# Patient Record
Sex: Male | Born: 1985 | Race: White | Hispanic: No | Marital: Single | Smoking: Current every day smoker
Health system: Southern US, Community
[De-identification: ages and names within clinical notes are randomized; demographics above are authoritative.]

---

## 2017-12-28 ENCOUNTER — Encounter (HOSPITAL_COMMUNITY): Payer: Self-pay | Admitting: Emergency Medicine

## 2017-12-28 ENCOUNTER — Emergency Department (HOSPITAL_COMMUNITY): Payer: Self-pay

## 2017-12-28 ENCOUNTER — Emergency Department (HOSPITAL_COMMUNITY)
Admission: EM | Admit: 2017-12-28 | Discharge: 2017-12-28 | Disposition: A | Discharge status: Home / Self Care | Payer: Self-pay | Attending: Emergency Medicine | Admitting: Emergency Medicine

## 2017-12-28 DIAGNOSIS — F17200 Nicotine dependence, unspecified, uncomplicated: Secondary | ICD-10-CM | POA: Insufficient documentation

## 2017-12-28 DIAGNOSIS — R079 Chest pain, unspecified: Secondary | ICD-10-CM

## 2017-12-28 DIAGNOSIS — F19929 Other psychoactive substance use, unspecified with intoxication, unspecified: Secondary | ICD-10-CM

## 2017-12-28 DIAGNOSIS — R1084 Generalized abdominal pain: Secondary | ICD-10-CM

## 2017-12-28 LAB — BASIC METABOLIC PANEL
Anion gap: 12 (ref 5–15)
BUN: 10 mg/dL (ref 6–20)
CO2: 22 mmol/L (ref 22–32)
CREATININE: 1.16 mg/dL (ref 0.61–1.24)
Calcium: 10.3 mg/dL (ref 8.9–10.3)
Chloride: 102 mmol/L (ref 98–111)
GFR calc Af Amer: >60 mL/min (ref >60)
GLUCOSE: 103 mg/dL — AB (ref 70–99)
POTASSIUM: 4.3 mmol/L (ref 3.5–5.1)
Sodium: 136 mmol/L (ref 135–145)

## 2017-12-28 LAB — URINALYSIS, ROUTINE W REFLEX MICROSCOPIC
Bilirubin Urine: NEGATIVE
GLUCOSE, UA: NEGATIVE mg/dL
Hgb urine dipstick: NEGATIVE
Ketones, ur: 80 mg/dL — AB
Leukocytes, UA: NEGATIVE
NITRITE: NEGATIVE
PH: 5 (ref 5.0–8.0)
PROTEIN: 30 mg/dL — AB
SPECIFIC GRAVITY, URINE: 1.029 (ref 1.005–1.030)

## 2017-12-28 LAB — RAPID URINE DRUG SCREEN, HOSP PERFORMED
AMPHETAMINES: POSITIVE — AB
BARBITURATES: NOT DETECTED
BENZODIAZEPINES: NOT DETECTED
Cocaine: POSITIVE — AB
Opiates: NOT DETECTED
Tetrahydrocannabinol: NOT DETECTED

## 2017-12-28 LAB — CBC
HEMATOCRIT: 47.9 % (ref 39.0–52.0)
HEMOGLOBIN: 16.3 g/dL (ref 13.0–17.0)
MCH: 30.5 pg (ref 26.0–34.0)
MCHC: 34 g/dL (ref 30.0–36.0)
MCV: 89.7 fL (ref 80.0–100.0)
Platelets: 291 10*3/uL (ref 150–400)
RBC: 5.34 MIL/uL (ref 4.22–5.81)
RDW: 12.2 % (ref 11.5–15.5)
WBC: 9.7 10*3/uL (ref 4.0–10.5)
nRBC: 0 % (ref 0.0–0.2)

## 2017-12-28 LAB — I-STAT TROPONIN, ED: TROPONIN I, POC: 0 ng/mL (ref 0.00–0.08)

## 2017-12-28 LAB — LIPASE, BLOOD: LIPASE: 30 U/L (ref 11–51)

## 2017-12-28 LAB — HEPATIC FUNCTION PANEL
ALT: 19 U/L (ref 0–44)
AST: 25 U/L (ref 15–41)
Albumin: 4.7 g/dL (ref 3.5–5.0)
Alkaline Phosphatase: 62 U/L (ref 38–126)
BILIRUBIN DIRECT: 0.2 mg/dL (ref 0.0–0.2)
BILIRUBIN INDIRECT: 1.1 mg/dL — AB (ref 0.3–0.9)
Total Bilirubin: 1.3 mg/dL — ABNORMAL HIGH (ref 0.3–1.2)
Total Protein: 8 g/dL (ref 6.5–8.1)

## 2017-12-28 LAB — ETHANOL

## 2017-12-28 MED ORDER — SODIUM CHLORIDE 0.9 % IV BOLUS
1000.0000 mL | Freq: Once | INTRAVENOUS | Status: AC
  Administered 2017-12-28: 1000 mL via INTRAVENOUS

## 2017-12-28 MED ORDER — ONDANSETRON HCL 4 MG/2ML IJ SOLN
4.0000 mg | Freq: Once | INTRAMUSCULAR | Status: AC
  Administered 2017-12-28: 4 mg via INTRAVENOUS
  Filled 2017-12-28: qty 2

## 2017-12-28 MED ORDER — KETOROLAC TROMETHAMINE 30 MG/ML IJ SOLN
30.0000 mg | Freq: Once | INTRAMUSCULAR | Status: AC
  Administered 2017-12-28: 30 mg via INTRAVENOUS
  Filled 2017-12-28: qty 1

## 2017-12-28 NOTE — Discharge Instructions (Signed)
You were seen in the ED today with symptoms likely related to unintentional intoxication. We did find traces of cocaine and amphetamine in your urine test. You should not drive your car home. You will be taking the bus. Your labs are normal. Call the PCP listed for a follow up appointment. Return to the ED with any new or worsening symptoms.

## 2017-12-28 NOTE — ED Notes (Signed)
Pt states that he will take the bus home and will not drive his car.
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
## 2017-12-28 NOTE — ED Notes (Signed)
Patient transported to CT 

## 2017-12-30 LAB — URINE CULTURE
Culture: NO GROWTH
Special Requests: NORMAL

## 2018-07-19 ENCOUNTER — Emergency Department (HOSPITAL_COMMUNITY): Payer: Self-pay

## 2018-07-19 ENCOUNTER — Other Ambulatory Visit: Payer: Self-pay

## 2018-07-19 ENCOUNTER — Encounter (HOSPITAL_COMMUNITY): Payer: Self-pay

## 2018-07-19 ENCOUNTER — Emergency Department (HOSPITAL_COMMUNITY)
Admission: EM | Admit: 2018-07-19 | Discharge: 2018-07-19 | Disposition: A | Discharge status: Home / Self Care | Payer: Self-pay | Attending: Emergency Medicine | Admitting: Emergency Medicine

## 2018-07-19 DIAGNOSIS — Z23 Encounter for immunization: Secondary | ICD-10-CM | POA: Insufficient documentation

## 2018-07-19 DIAGNOSIS — F17200 Nicotine dependence, unspecified, uncomplicated: Secondary | ICD-10-CM | POA: Insufficient documentation

## 2018-07-19 DIAGNOSIS — R51 Headache: Secondary | ICD-10-CM | POA: Insufficient documentation

## 2018-07-19 DIAGNOSIS — Y9259 Other trade areas as the place of occurrence of the external cause: Secondary | ICD-10-CM | POA: Insufficient documentation

## 2018-07-19 DIAGNOSIS — S80212A Abrasion, left knee, initial encounter: Secondary | ICD-10-CM | POA: Insufficient documentation

## 2018-07-19 DIAGNOSIS — S80211A Abrasion, right knee, initial encounter: Secondary | ICD-10-CM | POA: Insufficient documentation

## 2018-07-19 DIAGNOSIS — R52 Pain, unspecified: Secondary | ICD-10-CM

## 2018-07-19 DIAGNOSIS — Y929 Unspecified place or not applicable: Secondary | ICD-10-CM | POA: Insufficient documentation

## 2018-07-19 DIAGNOSIS — S0181XA Laceration without foreign body of other part of head, initial encounter: Secondary | ICD-10-CM

## 2018-07-19 DIAGNOSIS — Y999 Unspecified external cause status: Secondary | ICD-10-CM | POA: Insufficient documentation

## 2018-07-19 DIAGNOSIS — Y9389 Activity, other specified: Secondary | ICD-10-CM | POA: Insufficient documentation

## 2018-07-19 MED ORDER — TETANUS-DIPHTH-ACELL PERTUSSIS 5-2.5-18.5 LF-MCG/0.5 IM SUSP
0.5000 mL | Freq: Once | INTRAMUSCULAR | Status: AC
  Administered 2018-07-19: 03:00:00 0.5 mL via INTRAMUSCULAR
  Filled 2018-07-19: qty 0.5

## 2018-07-19 MED ORDER — LIDOCAINE-EPINEPHRINE (PF) 2 %-1:200000 IJ SOLN
20.0000 mL | Freq: Once | INTRAMUSCULAR | Status: AC
  Administered 2018-07-19: 20 mL
  Filled 2018-07-19: qty 20
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
## 2018-07-19 NOTE — ED Notes (Signed)
Patient transported to X-ray 
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
## 2018-07-19 NOTE — Discharge Instructions (Signed)
Your CT scans are negative for fracture or serious brain injury.  As we discussed you may have damage to the muscle or nerve that controls your eyebrow movement.  This should be rechecked by the ENT doctor in the office.  Your stitches should be removed in 1 week.  Return to the ED with worsening pain, fever, chills, nausea, vomiting or other concerns.

## 2018-07-19 NOTE — ED Notes (Signed)
Patient verbalized understanding of dc instructions, vss, ambulatory with nad.   

## 2018-07-19 NOTE — ED Notes (Signed)
Patient ambulated with steady gait and stated that he felt fine. Dr. Manus Gunning has been notified.

## 2018-07-19 NOTE — ED Notes (Signed)
ED Provider at bedside. 

## 2018-07-19 NOTE — ED Notes (Signed)
MD at bedside suturing

## 2018-07-21 ENCOUNTER — Other Ambulatory Visit: Payer: Self-pay

## 2018-07-21 ENCOUNTER — Emergency Department (HOSPITAL_COMMUNITY)
Admission: EM | Admit: 2018-07-21 | Discharge: 2018-07-21 | Discharge status: Against Medical Advice | Payer: Self-pay | Attending: Emergency Medicine | Admitting: Emergency Medicine

## 2018-07-21 ENCOUNTER — Encounter (HOSPITAL_COMMUNITY): Payer: Self-pay | Admitting: Emergency Medicine

## 2018-07-21 DIAGNOSIS — Z5321 Procedure and treatment not carried out due to patient leaving prior to being seen by health care provider: Secondary | ICD-10-CM | POA: Insufficient documentation

## 2018-07-21 NOTE — ED Triage Notes (Signed)
Pt. Stated, Ijust need to talk to a Dr. About my stitches.

## 2018-07-25 ENCOUNTER — Emergency Department (HOSPITAL_COMMUNITY)
Admission: EM | Admit: 2018-07-25 | Discharge: 2018-07-25 | Disposition: A | Discharge status: Home / Self Care | Payer: Self-pay | Attending: Emergency Medicine | Admitting: Emergency Medicine

## 2018-07-25 ENCOUNTER — Other Ambulatory Visit: Payer: Self-pay

## 2018-07-25 ENCOUNTER — Encounter (HOSPITAL_COMMUNITY): Payer: Self-pay | Admitting: Emergency Medicine

## 2018-07-25 DIAGNOSIS — S0181XD Laceration without foreign body of other part of head, subsequent encounter: Secondary | ICD-10-CM | POA: Insufficient documentation

## 2018-07-25 DIAGNOSIS — F17200 Nicotine dependence, unspecified, uncomplicated: Secondary | ICD-10-CM | POA: Insufficient documentation

## 2018-07-25 DIAGNOSIS — Z4802 Encounter for removal of sutures: Secondary | ICD-10-CM | POA: Insufficient documentation

## 2018-07-25 DIAGNOSIS — X58XXXD Exposure to other specified factors, subsequent encounter: Secondary | ICD-10-CM | POA: Insufficient documentation

## 2018-07-25 NOTE — Discharge Instructions (Addendum)
Continue to wash daily with soap and water.  Do not scrub vigorously or the Steri-Strips will fall off. The Steri-Strips will fall off on their own in several days. Return to the emergency room if you develop fevers, severe worsening pain, pus from the area, or any new, worsening, or concerning symptoms.

## 2018-07-25 NOTE — ED Triage Notes (Signed)
Pt here to have stiches removed from above left eye.
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
## 2019-06-18 ENCOUNTER — Emergency Department (HOSPITAL_COMMUNITY): Payer: Self-pay

## 2019-06-18 ENCOUNTER — Inpatient Hospital Stay (HOSPITAL_COMMUNITY)
Admission: EM | Admit: 2019-06-18 | Discharge: 2019-06-18 | DRG: 917 | Discharge status: Against Medical Advice | Payer: Self-pay | Attending: Internal Medicine | Admitting: Internal Medicine

## 2019-06-18 ENCOUNTER — Inpatient Hospital Stay (HOSPITAL_COMMUNITY): Payer: Self-pay

## 2019-06-18 DIAGNOSIS — E878 Other disorders of electrolyte and fluid balance, not elsewhere classified: Secondary | ICD-10-CM | POA: Diagnosis present

## 2019-06-18 DIAGNOSIS — T510X1A Toxic effect of ethanol, accidental (unintentional), initial encounter: Principal | ICD-10-CM | POA: Diagnosis present

## 2019-06-18 DIAGNOSIS — R4189 Other symptoms and signs involving cognitive functions and awareness: Secondary | ICD-10-CM

## 2019-06-18 DIAGNOSIS — Z978 Presence of other specified devices: Secondary | ICD-10-CM

## 2019-06-18 DIAGNOSIS — X31XXXA Exposure to excessive natural cold, initial encounter: Secondary | ICD-10-CM

## 2019-06-18 DIAGNOSIS — R7989 Other specified abnormal findings of blood chemistry: Secondary | ICD-10-CM

## 2019-06-18 DIAGNOSIS — Y909 Presence of alcohol in blood, level not specified: Secondary | ICD-10-CM | POA: Diagnosis present

## 2019-06-18 DIAGNOSIS — F10129 Alcohol abuse with intoxication, unspecified: Secondary | ICD-10-CM | POA: Diagnosis present

## 2019-06-18 DIAGNOSIS — G92 Toxic encephalopathy: Secondary | ICD-10-CM | POA: Diagnosis present

## 2019-06-18 DIAGNOSIS — R569 Unspecified convulsions: Secondary | ICD-10-CM

## 2019-06-18 DIAGNOSIS — Z5329 Procedure and treatment not carried out because of patient's decision for other reasons: Secondary | ICD-10-CM | POA: Diagnosis not present

## 2019-06-18 DIAGNOSIS — J9690 Respiratory failure, unspecified, unspecified whether with hypoxia or hypercapnia: Secondary | ICD-10-CM | POA: Diagnosis present

## 2019-06-18 DIAGNOSIS — T68XXXA Hypothermia, initial encounter: Secondary | ICD-10-CM | POA: Diagnosis present

## 2019-06-18 DIAGNOSIS — J9601 Acute respiratory failure with hypoxia: Secondary | ICD-10-CM

## 2019-06-18 DIAGNOSIS — R739 Hyperglycemia, unspecified: Secondary | ICD-10-CM | POA: Diagnosis present

## 2019-06-18 DIAGNOSIS — Z20822 Contact with and (suspected) exposure to covid-19: Secondary | ICD-10-CM | POA: Diagnosis present

## 2019-06-18 DIAGNOSIS — R578 Other shock: Secondary | ICD-10-CM | POA: Diagnosis present

## 2019-06-18 LAB — CBC WITH DIFFERENTIAL/PLATELET
Abs Immature Granulocytes: 0.04 10*3/uL (ref 0.00–0.07)
Basophils Absolute: 0 10*3/uL (ref 0.0–0.1)
Basophils Relative: 0 %
Eosinophils Absolute: 0.1 10*3/uL (ref 0.0–0.5)
Eosinophils Relative: 1 %
HCT: 46.6 % (ref 39.0–52.0)
Hemoglobin: 15.1 g/dL (ref 13.0–17.0)
Immature Granulocytes: 0 %
Lymphocytes Relative: 26 %
Lymphs Abs: 2.4 10*3/uL (ref 0.7–4.0)
MCH: 30.9 pg (ref 26.0–34.0)
MCHC: 32.4 g/dL (ref 30.0–36.0)
MCV: 95.5 fL (ref 80.0–100.0)
Monocytes Absolute: 0.6 10*3/uL (ref 0.1–1.0)
Monocytes Relative: 6 %
Neutro Abs: 6.1 10*3/uL (ref 1.7–7.7)
Neutrophils Relative %: 67 %
Platelets: 235 10*3/uL (ref 150–400)
RBC: 4.88 MIL/uL (ref 4.22–5.81)
RDW: 12.3 % (ref 11.5–15.5)
WBC: 9.2 10*3/uL (ref 4.0–10.5)
nRBC: 0 % (ref 0.0–0.2)

## 2019-06-18 LAB — COMPREHENSIVE METABOLIC PANEL
ALT: 19 U/L (ref 0–44)
ALT: 15 U/L (ref 0–44)
AST: 32 U/L (ref 15–41)
AST: 24 U/L (ref 15–41)
Albumin: 4.3 g/dL (ref 3.5–5.0)
Albumin: 3.4 g/dL — ABNORMAL LOW (ref 3.5–5.0)
Alkaline Phosphatase: 71 U/L (ref 38–126)
Alkaline Phosphatase: 47 U/L (ref 38–126)
Anion gap: 13 (ref 5–15)
Anion gap: 6 (ref 5–15)
BUN: 13 mg/dL (ref 6–20)
BUN: 10 mg/dL (ref 6–20)
CO2: 24 mmol/L (ref 22–32)
CO2: 22 mmol/L (ref 22–32)
Calcium: 8.8 mg/dL — ABNORMAL LOW (ref 8.9–10.3)
Calcium: 8.2 mg/dL — ABNORMAL LOW (ref 8.9–10.3)
Chloride: 99 mmol/L (ref 98–111)
Chloride: 109 mmol/L (ref 98–111)
Creatinine, Ser: 1.12 mg/dL (ref 0.61–1.24)
Creatinine, Ser: 0.77 mg/dL (ref 0.61–1.24)
GFR calc Af Amer: 46 mL/min — ABNORMAL LOW (ref >60)
GFR calc Af Amer: >60 mL/min (ref >60)
GFR calc non Af Amer: 39 mL/min — ABNORMAL LOW (ref >60)
GFR calc non Af Amer: 55 mL/min — ABNORMAL LOW (ref >60)
Glucose, Bld: 274 mg/dL — ABNORMAL HIGH (ref 70–99)
Glucose, Bld: 83 mg/dL (ref 70–99)
Potassium: 5 mmol/L (ref 3.5–5.1)
Potassium: 3.5 mmol/L (ref 3.5–5.1)
Sodium: 136 mmol/L (ref 135–145)
Sodium: 137 mmol/L (ref 135–145)
Total Bilirubin: 0.4 mg/dL (ref 0.3–1.2)
Total Bilirubin: 0.6 mg/dL (ref 0.3–1.2)
Total Protein: 7.4 g/dL (ref 6.5–8.1)
Total Protein: 5.5 g/dL — ABNORMAL LOW (ref 6.5–8.1)

## 2019-06-18 LAB — SALICYLATE LEVEL: Salicylate Lvl: <7 mg/dL — ABNORMAL LOW (ref 7.0–30.0)

## 2019-06-18 LAB — POCT I-STAT 7, (LYTES, BLD GAS, ICA,H+H)
Acid-base deficit: 4 mmol/L — ABNORMAL HIGH (ref 0.0–2.0)
Acid-base deficit: 2 mmol/L (ref 0.0–2.0)
Bicarbonate: 22.6 mmol/L (ref 20.0–28.0)
Bicarbonate: 23.9 mmol/L (ref 20.0–28.0)
Calcium, Ion: 1.18 mmol/L (ref 1.15–1.40)
Calcium, Ion: 1.14 mmol/L — ABNORMAL LOW (ref 1.15–1.40)
HCT: 45 % (ref 39.0–52.0)
HCT: 35 % — ABNORMAL LOW (ref 39.0–52.0)
Hemoglobin: 15.3 g/dL (ref 13.0–17.0)
Hemoglobin: 11.9 g/dL — ABNORMAL LOW (ref 13.0–17.0)
O2 Saturation: 86 %
O2 Saturation: 100 %
Patient temperature: 90.6
Patient temperature: 93.1
Potassium: 4.1 mmol/L (ref 3.5–5.1)
Potassium: 4 mmol/L (ref 3.5–5.1)
Sodium: 138 mmol/L (ref 135–145)
Sodium: 139 mmol/L (ref 135–145)
TCO2: 24 mmol/L (ref 22–32)
TCO2: 25 mmol/L (ref 22–32)
pCO2 arterial: 38.7 mmHg (ref 32.0–48.0)
pCO2 arterial: 40.4 mmHg (ref 32.0–48.0)
pH, Arterial: 7.352 (ref 7.350–7.450)
pH, Arterial: 7.365 (ref 7.350–7.450)
pO2, Arterial: 42 mmHg — ABNORMAL LOW (ref 83.0–108.0)
pO2, Arterial: 594 mmHg — ABNORMAL HIGH (ref 83.0–108.0)

## 2019-06-18 LAB — LACTIC ACID, PLASMA
Lactic Acid, Venous: 4.2 mmol/L (ref 0.5–1.9)
Lactic Acid, Venous: 3 mmol/L (ref 0.5–1.9)
Lactic Acid, Venous: 2 mmol/L (ref 0.5–1.9)

## 2019-06-18 LAB — CK TOTAL AND CKMB (NOT AT ARMC)
CK, MB: 5.1 ng/mL — ABNORMAL HIGH (ref 0.5–5.0)
Relative Index: 1.9 (ref 0.0–2.5)
Total CK: 274 U/L (ref 49–397)

## 2019-06-18 LAB — AMYLASE: Amylase: 66 U/L (ref 28–100)

## 2019-06-18 LAB — RESPIRATORY PANEL BY RT PCR (FLU A&B, COVID)
Influenza A by PCR: NEGATIVE
Influenza B by PCR: NEGATIVE
SARS Coronavirus 2 by RT PCR: NEGATIVE

## 2019-06-18 LAB — URINALYSIS, ROUTINE W REFLEX MICROSCOPIC
Bilirubin Urine: NEGATIVE
Glucose, UA: >=500 mg/dL — AB
Hgb urine dipstick: NEGATIVE
Ketones, ur: NEGATIVE mg/dL
Leukocytes,Ua: NEGATIVE
Nitrite: NEGATIVE
Protein, ur: NEGATIVE mg/dL
Specific Gravity, Urine: 1.011 (ref 1.005–1.030)
pH: 5 (ref 5.0–8.0)

## 2019-06-18 LAB — RAPID URINE DRUG SCREEN, HOSP PERFORMED
Amphetamines: NOT DETECTED
Barbiturates: NOT DETECTED
Benzodiazepines: POSITIVE — AB
Cocaine: NOT DETECTED
Opiates: NOT DETECTED
Tetrahydrocannabinol: NOT DETECTED

## 2019-06-18 LAB — VOLATILES,BLD-ACETONE,ETHANOL,ISOPROP,METHANOL
Acetone, blood: <0.01 g/dL (ref 0.000–0.010)
Ethanol, blood: 0.042 g/dL — ABNORMAL HIGH (ref 0.000–0.010)
Isopropanol, blood: <0.01 g/dL (ref 0.000–0.010)
Methanol, blood: <0.01 g/dL (ref 0.000–0.010)

## 2019-06-18 LAB — PHOSPHORUS: Phosphorus: 1.9 mg/dL — ABNORMAL LOW (ref 2.5–4.6)

## 2019-06-18 LAB — MAGNESIUM
Magnesium: 2.3 mg/dL (ref 1.7–2.4)
Magnesium: 1.5 mg/dL — ABNORMAL LOW (ref 1.7–2.4)

## 2019-06-18 LAB — ACETAMINOPHEN LEVEL: Acetaminophen (Tylenol), Serum: <10 ug/mL — ABNORMAL LOW (ref 10–30)

## 2019-06-18 LAB — ETHYLENE GLYCOL: Ethylene Glycol Lvl: <5 mg/dL

## 2019-06-18 LAB — ETHANOL: Alcohol, Ethyl (B): 130 mg/dL — ABNORMAL HIGH (ref <10)

## 2019-06-18 LAB — CBG MONITORING, ED: Glucose-Capillary: 251 mg/dL — ABNORMAL HIGH (ref 70–99)

## 2019-06-18 LAB — MRSA PCR SCREENING: MRSA by PCR: NEGATIVE

## 2019-06-18 LAB — CK: Total CK: 213 U/L (ref 49–397)

## 2019-06-18 LAB — LIPASE, BLOOD: Lipase: 24 U/L (ref 11–51)

## 2019-06-18 MED ORDER — MIDAZOLAM HCL 2 MG/2ML IJ SOLN
2.0000 mg | INTRAMUSCULAR | Status: DC | PRN
  Filled 2019-06-18: qty 2

## 2019-06-18 MED ORDER — SODIUM CHLORIDE 0.9 % IV SOLN: INTRAVENOUS | Status: DC

## 2019-06-18 MED ORDER — DOCUSATE SODIUM 50 MG/5ML PO LIQD
100.0000 mg | Freq: Two times a day (BID) | ORAL | Status: DC
  Filled 2019-06-18 (×2): qty 10

## 2019-06-18 MED ORDER — SODIUM CHLORIDE 0.9 % IV BOLUS
1000.0000 mL | Freq: Once | INTRAVENOUS | Status: AC
  Administered 2019-06-18: 05:00:00 1000 mL via INTRAVENOUS

## 2019-06-18 MED ORDER — LACTATED RINGERS IV SOLN: INTRAVENOUS | Status: DC

## 2019-06-18 MED ORDER — ETOMIDATE 2 MG/ML IV SOLN
25.0000 mg | Freq: Once | INTRAVENOUS | Status: AC
  Administered 2019-06-18: 04:00:00 25 mg via INTRAVENOUS

## 2019-06-18 MED ORDER — PANTOPRAZOLE SODIUM 40 MG IV SOLR: 40.0000 mg | Freq: Every day | INTRAVENOUS | Status: DC

## 2019-06-18 MED ORDER — CHLORHEXIDINE GLUCONATE CLOTH 2 % EX PADS
6.0000 | MEDICATED_PAD | Freq: Every day | CUTANEOUS | Status: DC
  Administered 2019-06-18: 10:00:00 6 via TOPICAL

## 2019-06-18 MED ORDER — SODIUM CHLORIDE 0.9 % IV BOLUS
1000.0000 mL | Freq: Once | INTRAVENOUS | Status: AC
  Administered 2019-06-18: 04:00:00 1000 mL via INTRAVENOUS

## 2019-06-18 MED ORDER — THIAMINE HCL 100 MG/ML IJ SOLN
100.0000 mg | Freq: Every day | INTRAMUSCULAR | Status: DC
  Filled 2019-06-18: qty 2

## 2019-06-18 MED ORDER — FENTANYL CITRATE (PF) 100 MCG/2ML IJ SOLN
100.0000 ug | INTRAMUSCULAR | Status: DC | PRN
  Filled 2019-06-18: qty 2

## 2019-06-18 MED ORDER — PROPOFOL 1000 MG/100ML IV EMUL: 0.0000 ug/kg/min | INTRAVENOUS | Status: DC

## 2019-06-18 MED ORDER — POLYETHYLENE GLYCOL 3350 17 G PO PACK
17.0000 g | PACK | Freq: Every day | ORAL | Status: DC
  Filled 2019-06-18: qty 1

## 2019-06-18 MED ORDER — DOCUSATE SODIUM 100 MG PO CAPS: 100.0000 mg | ORAL_CAPSULE | Freq: Two times a day (BID) | ORAL | Status: DC | PRN

## 2019-06-18 MED ORDER — ORAL CARE MOUTH RINSE
15.0000 mL | OROMUCOSAL | Status: DC
  Administered 2019-06-18: 15 mL via OROMUCOSAL

## 2019-06-18 MED ORDER — POLYETHYLENE GLYCOL 3350 17 G PO PACK: 17.0000 g | PACK | Freq: Every day | ORAL | Status: DC | PRN

## 2019-06-18 MED ORDER — ROCURONIUM BROMIDE 50 MG/5ML IV SOLN
75.0000 mg | Freq: Once | INTRAVENOUS | Status: AC
  Administered 2019-06-18: 04:00:00 75 mg via INTRAVENOUS

## 2019-06-18 MED ORDER — MAGNESIUM SULFATE 4 GM/100ML IV SOLN
4.0000 g | Freq: Once | INTRAVENOUS | Status: DC
  Filled 2019-06-18: qty 100

## 2019-06-18 MED ORDER — CHLORHEXIDINE GLUCONATE 0.12% ORAL RINSE (MEDLINE KIT)
15.0000 mL | Freq: Two times a day (BID) | OROMUCOSAL | Status: DC
  Administered 2019-06-18: 10:00:00 15 mL via OROMUCOSAL

## 2019-06-18 MED ORDER — NOREPINEPHRINE 4 MG/250ML-% IV SOLN
2.0000 ug/min | INTRAVENOUS | Status: DC
  Administered 2019-06-18: 2 ug/min via INTRAVENOUS
  Filled 2019-06-18: qty 250

## 2019-06-18 MED ORDER — FOLIC ACID 5 MG/ML IJ SOLN
1.0000 mg | Freq: Every day | INTRAMUSCULAR | Status: DC
  Filled 2019-06-18: qty 0.2

## 2019-06-18 MED ORDER — LORAZEPAM 2 MG/ML IJ SOLN: 2.0000 mg | INTRAMUSCULAR | Status: DC | PRN

## 2019-06-18 MED ORDER — HEPARIN SODIUM (PORCINE) 5000 UNIT/ML IJ SOLN: 5000.0000 [IU] | Freq: Three times a day (TID) | INTRAMUSCULAR | Status: DC

## 2019-06-18 MED ORDER — LORAZEPAM 2 MG/ML IJ SOLN
INTRAMUSCULAR | Status: AC
  Administered 2019-06-18: 04:00:00 2 mg
  Filled 2019-06-18: qty 1

## 2019-06-18 MED ORDER — ROCURONIUM BROMIDE 50 MG/5ML IV SOLN: 75.0000 mg | Freq: Once | INTRAVENOUS | Status: DC

## 2019-06-18 MED ORDER — SODIUM CHLORIDE 0.9 % IV SOLN
250.0000 mL | INTRAVENOUS | Status: DC
  Administered 2019-06-18: 08:00:00 250 mL via INTRAVENOUS

## 2019-06-18 MED ORDER — POTASSIUM PHOSPHATES 15 MMOLE/5ML IV SOLN
24.0000 mmol | Freq: Once | INTRAVENOUS | Status: DC
  Filled 2019-06-18: qty 8

## 2019-06-18 MED ORDER — MIDAZOLAM HCL 2 MG/2ML IJ SOLN
2.0000 mg | INTRAMUSCULAR | Status: DC | PRN
  Administered 2019-06-18 (×2): 2 mg via INTRAVENOUS
  Filled 2019-06-18: qty 2

## 2019-06-18 MED ORDER — MIDAZOLAM 50MG/50ML (1MG/ML) PREMIX INFUSION
0.0000 mg/h | INTRAVENOUS | Status: DC
  Administered 2019-06-18: 08:00:00 0.5 mg/h via INTRAVENOUS
  Filled 2019-06-18: qty 50

## 2019-06-18 MED ORDER — CALCIUM GLUCONATE-NACL 1-0.675 GM/50ML-% IV SOLN
1.0000 g | Freq: Once | INTRAVENOUS | Status: AC
  Administered 2019-06-18: 07:00:00 1000 mg via INTRAVENOUS
  Filled 2019-06-18: qty 50

## 2019-06-18 MED FILL — Medication: Qty: 1 | Status: AC

## 2019-06-18 NOTE — ED Notes (Signed)
75 rocuronium IVP

## 2019-06-18 NOTE — Progress Notes (Signed)
EEG completed, results pending. 

## 2019-06-18 NOTE — ED Notes (Addendum)
Vocal cords visualized. Pt intubated with pos color change on calorimeter.

## 2019-06-18 NOTE — ED Notes (Signed)
Nasal trumpets removed
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
## 2019-06-18 NOTE — Progress Notes (Signed)
Pt insisting that he is leaving the hospital. Per pt, "I can't waste any more time here". Pt refusing to wear monitoring devices but is intermittently allowing me to spot check his vitals.

## 2019-06-18 NOTE — Progress Notes (Signed)
Pt out in hallway demanding to leave. Insisting he does not want to be hooked back up to IV pump.   Cell phone returned to pt, along with 5$ from pocket, clothing, shoes, lighter, cigarettes, hat, keys and loose change.   Both IVs removed from bilateral ACs.  Pt did verify identity: Nicholas Hutchinson DOB [birth year]  AMA paperwork signed by this RN and witnessed by Luther Bradley RN

## 2019-06-18 NOTE — ED Notes (Signed)
MD attempting intubation

## 2019-06-18 NOTE — Progress Notes (Signed)
Inpatient Diabetes Program Recommendations  AACE/ADA: New Consensus Statement on Inpatient Glycemic Control  Target Ranges:  Prepandial:   less than 140 mg/dL      Peak postprandial:   less than 180 mg/dL (1-2 hours)      Critically ill patients:  140 - 180 mg/dL   Results for Brantley Persons [facility] (MRN [id number]) as of 06/18/2019 09:53  Ref. Range 06/18/2019 03:46  Glucose-Capillary Latest Ref Range: 70 - 99 mg/dL 740 (H)  Results for Brantley Persons [facility] (MRN [id number]) as of 06/18/2019 09:53  Ref. Range 06/18/2019 04:07  Glucose Latest Ref Range: 70 - 99 mg/dL 737 (H)   Review of Glycemic Control  Diabetes history: Unknown Outpatient Diabetes medications: unknown Current orders for Inpatient glycemic control: None  Inpatient Diabetes Program Recommendations:    Insulin-Please consider using ICU Glycemic control order set to order CBGs Q4H with Novolog correction Q4H.  HbgA1C: Please consider ordering an A1C to evaluate glycemic control over the past 2-3 months.  Thanks, Orlando Penner, RN, MSN, CDE Diabetes Coordinator Inpatient Diabetes Program [phone] (Team Pager from 8am to 5pm)

## 2019-06-18 NOTE — ED Notes (Signed)
Warm IV fluids ordered to bedside.

## 2019-06-18 NOTE — ED Notes (Signed)
Bair Hugger to bedside.

## 2019-06-18 NOTE — ED Notes (Signed)
resp paged for transport

## 2019-06-18 NOTE — ED Provider Notes (Signed)
[facility] EMERGENCY DEPARTMENT Provider Note  CSN: [id number] Arrival date & time: 06/18/19 0343  Chief Complaint(s) Seizures ED Triage Notes Seymour Bars, RN (Registered Nurse) . [facility] . . 06/18/2019 3:47 AM . . Signed   Pt found unresponsive outside of a hotel for "hours" per hotel staff. [facility] reports bottle of unknown alcohol. Pinpoint pupils. 2 mg narcan given. Pt then began seizing per EMS and was given total of 7.5 mg versed. Pt was brought in being bagged for respiratory support with two trumpets in bilateral nares. 18 g L AC      HPI Nicholas Hutchinson is a 34 y.o. male present for unresponsiveness in the setting of prolonged cold weather exposure who began seizing en route with EMS, no response to Narcan or Versed.  Remainder of history, ROS, and physical exam limited due to patient's condition (unresponsiveness). Additional information was obtained from EMS.   Level V Caveat.    HPI  Past Medical History No past medical history on file. Patient Active Problem List   Diagnosis Date Noted  . Seizures (HCC) 06/18/2019   Home Medication(s) Prior to Admission medications   Not on File                                                                                                                                    Past Surgical History ** The histories are not reviewed yet. Please review them in the "History" navigator section and refresh this SmartLink. Family History No family history on file.  Social History Social History   Tobacco Use  . Smoking status: Not on file  Substance Use Topics  . Alcohol use: Not on file  . Drug use: Not on file   Allergies Patient has no allergy information on record.  Review of Systems Review of Systems  Unable to perform ROS: Patient unresponsive    Physical Exam Vital Signs  I have reviewed the triage vital signs BP 128/79 (BP Location: Left Arm)   Pulse 81   Temp (!) 90.6  F (32.6 C) (Rectal)   Resp 14   Ht 5\' 11"  (1.803 m)   Wt 81.6 kg   SpO2 100%   BMI 25.10 kg/m   Physical Exam Vitals reviewed.  Constitutional:      Appearance: He is well-developed. He is not diaphoretic.     Interventions: Face mask in place.  HENT:     Head: Normocephalic and atraumatic.     Comments: Foaming at the mouth    Nose: Nose normal.  Eyes:     General: No scleral icterus.       Right eye: No discharge.        Left eye: No discharge.     Conjunctiva/sclera: Conjunctivae normal.     Pupils: Pupils are equal, round, and reactive to light.  Cardiovascular:     Rate and Rhythm: Normal rate  and regular rhythm.     Heart sounds: No murmur. No friction rub. No gallop.   Pulmonary:     Effort: Tachypnea and respiratory distress present.     Breath sounds: Normal breath sounds. No stridor. No rales.  Abdominal:     General: There is no distension.     Palpations: Abdomen is soft.     Tenderness: There is no abdominal tenderness.  Musculoskeletal:        General: No tenderness.     Cervical back: Normal range of motion and neck supple.  Skin:    General: Skin is warm and dry.     Findings: No erythema or rash.  Neurological:     Mental Status: He is unresponsive.     Motor: Abnormal muscle tone (increased tone) and seizure activity present.     Comments: Decerebrate posturing     ED Results and Treatments Labs (all labs ordered are listed, but only abnormal results are displayed) Labs Reviewed  COMPREHENSIVE METABOLIC PANEL - Abnormal; Notable for the following components:      Result Value   Glucose, Bld 274 (*)    Calcium 8.8 (*)    GFR calc non Af Amer 39 (*)    GFR calc Af Amer 46 (*)    All other components within normal limits  CK TOTAL AND CKMB (NOT AT [facility]) - Abnormal; Notable for the following components:   CK, MB 5.1 (*)    All other components within normal limits  LACTIC ACID, PLASMA - Abnormal; Notable for the following components:    Lactic Acid, Venous 4.2 (*)    All other components within normal limits  LACTIC ACID, PLASMA - Abnormal; Notable for the following components:   Lactic Acid, Venous 3.0 (*)    All other components within normal limits  RAPID URINE DRUG SCREEN, HOSP PERFORMED - Abnormal; Notable for the following components:   Benzodiazepines POSITIVE (*)    All other components within normal limits  URINALYSIS, ROUTINE W REFLEX MICROSCOPIC - Abnormal; Notable for the following components:   Color, Urine STRAW (*)    Glucose, UA >=500 (*)    Bacteria, UA RARE (*)    All other components within normal limits  CBG MONITORING, ED - Abnormal; Notable for the following components:   Glucose-Capillary 251 (*)    All other components within normal limits  POCT I-STAT 7, (LYTES, BLD GAS, ICA,H+H) - Abnormal; Notable for the following components:   pO2, Arterial 42 (*)    Acid-base deficit 4.0 (*)    All other components within normal limits  POCT I-STAT 7, (LYTES, BLD GAS, ICA,H+H) - Abnormal; Notable for the following components:   pO2, Arterial 594 (*)    Calcium, Ion 1.14 (*)    HCT 35.0 (*)    Hemoglobin 11.9 (*)    All other components within normal limits  RESPIRATORY PANEL BY RT PCR (FLU A&B, COVID)  CULTURE, BLOOD (ROUTINE X 2)  CULTURE, BLOOD (ROUTINE X 2)  URINE CULTURE  CULTURE, RESPIRATORY  CBC WITH DIFFERENTIAL/PLATELET  MAGNESIUM  SALICYLATE LEVEL  ACETAMINOPHEN LEVEL  ETHANOL  BLOOD GAS, ARTERIAL  CBG MONITORING, ED  EKG  EKG Interpretation  Date/Time:  Friday June 18 2019 03:59:06 EDT Ventricular Rate:  79 PR Interval:    QRS Duration: 84 QT Interval:  401 QTC Calculation: 460 R Axis:   84 Text Interpretation: Sinus rhythm Borderline right axis deviation NO STEMI. No old tracing to compare Confirmed by Addison Lank [phone]) on 06/18/2019 4:32:58 AM       Radiology CT Head Wo Contrast  Result Date: 06/18/2019 CLINICAL DATA:  Head trauma. Found down. EXAM: CT HEAD WITHOUT CONTRAST TECHNIQUE: Contiguous axial images were obtained from the base of the skull through the vertex without intravenous contrast. COMPARISON:  None. FINDINGS: Brain: The ventricles are normal in size and configuration. No extra-axial fluid collections are identified. The gray-white differentiation is maintained. No CT findings for acute hemispheric infarction or intracranial hemorrhage. No mass lesions. The brainstem and cerebellum are normal. Vascular: No hyperdense vessels or obvious aneurysm. Skull: No acute skull fracture. No bone lesion. Sinuses/Orbits: Ethmoid and right maxillary sinus disease. The mastoid air cells and middle ear cavities are clear. The globes are intact. Other: No scalp hematoma or obvious laceration. There is moderate fluid and debris in the posterior nasopharynx probably related to the endotracheal tube and NG tubes. IMPRESSION: 1. No acute intracranial findings or skull fracture. 2. Ethmoid and right maxillary sinus disease. Electronically Signed   By: Marijo Sanes M.D.   On: 06/18/2019 05:25   DG Chest Port 1 View  Result Date: 06/18/2019 CLINICAL DATA:  Male of unknown age unresponsive. Intubated. EXAM: PORTABLE CHEST 1 VIEW COMPARISON:  None. FINDINGS: Portable AP supine view at 0411 hours. Endotracheal tube tip in good position between the level the clavicles and carina. Enteric tube courses to the abdomen and is looped in the left upper quadrant compatible with placement into the stomach. Normal cardiac size and mediastinal contours. Normal lung volumes. Allowing for portable technique the lungs are clear. No pneumothorax or pleural effusion evident on this supine view. No acute osseous abnormality identified. IMPRESSION: 1. Endotracheal tube tip in good position. Enteric tube placed into the stomach. 2. No acute cardiopulmonary abnormality identified.  Electronically Signed   By: Genevie Ann M.D.   On: 06/18/2019 04:46   DG Abd Portable 1 View  Result Date: 06/18/2019 CLINICAL DATA:  Male of unknown age unresponsive. Intubated. EXAM: PORTABLE ABDOMEN - 1 VIEW COMPARISON:  Portable chest from the same time. FINDINGS: Portable AP supine view at 0413 hours. Enteric tube placed into the stomach with side hole at the level of the gastric fundus. Mild gas distended stomach. Normal visible bowel gas pattern otherwise. Lung bases appear clear. No osseous abnormality identified. IMPRESSION: Enteric tube placed into the stomach which is mildly distended with gas. Tube side hole at the gastric fundus. Electronically Signed   By: Genevie Ann M.D.   On: 06/18/2019 04:48    Pertinent labs & imaging results that were available during my care of the patient were reviewed by me and considered in my medical decision making (see chart for details).  Medications Ordered in ED Medications  fentaNYL (SUBLIMAZE) injection 100 mcg (has no administration in time range)  fentaNYL (SUBLIMAZE) injection 100 mcg (has no administration in time range)  midazolam (VERSED) injection 2 mg (2 mg Intravenous Given 06/18/19 0537)  midazolam (VERSED) injection 2 mg (has no administration in time range)  sodium chloride 0.9 % bolus 1,000 mL (0 mLs Intravenous Stopped 06/18/19 0610)    And  sodium chloride 0.9 % bolus 1,000 mL (1,000  mLs Intravenous New Bag/Given 06/18/19 0523)    And  0.9 %  sodium chloride infusion ( Intravenous New Bag/Given 06/18/19 0610)  lactated ringers infusion (has no administration in time range)  heparin injection 5,000 Units (has no administration in time range)  pantoprazole (PROTONIX) injection 40 mg (has no administration in time range)  docusate sodium (COLACE) capsule 100 mg (has no administration in time range)  polyethylene glycol (MIRALAX / GLYCOLAX) packet 17 g (has no administration in time range)  calcium gluconate 1 g/ 50 mL sodium chloride IVPB (has  no administration in time range)  thiamine (B-1) injection 100 mg (has no administration in time range)  folic acid injection 1 mg (has no administration in time range)  LORazepam (ATIVAN) 2 MG/ML injection (2 mg  Given 06/18/19 0349)  etomidate (AMIDATE) injection 25 mg (25 mg Intravenous Given 06/18/19 0356)  rocuronium (ZEMURON) injection 75 mg (75 mg Intravenous Given 06/18/19 0356)                                                                                                                                    Procedures Procedure Name: Intubation Date/Time: 06/18/2019 4:27 AM Performed by: Nira Connardama, Mitsue Peery Eduardo, MD Pre-anesthesia Checklist: Patient identified, Patient being monitored, Emergency Drugs available, Timeout performed and Suction available Oxygen Delivery Method: Non-rebreather mask Preoxygenation: Pre-oxygenation with 100% oxygen Induction Type: Rapid sequence Ventilation: Mask ventilation without difficulty Laryngoscope Size: Glidescope and 4 Tube size: 7.5 mm Number of attempts: 1 Placement Confirmation: ETT inserted through vocal cords under direct vision,  CO2 detector and Breath sounds checked- equal and bilateral Secured at: 24 cm Tube secured with: ETT holder Difficulty Due To: Difficulty was unanticipated    .Critical Care Performed by: Nira Connardama, Marshelle Bilger Eduardo, MD Authorized by: Nira Connardama, Tymara Saur Eduardo, MD     CRITICAL CARE Performed by: Amadeo GarnetPedro Eduardo Vashti Bolanos Total critical care time:  78 minutes Critical care time was exclusive of separately billable procedures and treating other patients. Critical care was necessary to treat or prevent imminent or life-threatening deterioration. Critical care was time spent personally by me on the following activities: development of treatment plan with patient and/or surrogate as well as nursing, discussions with consultants, evaluation of patient's response to treatment, examination of patient, obtaining history from  patient or surrogate, ordering and performing treatments and interventions, ordering and review of laboratory studies, ordering and review of radiographic studies, pulse oximetry and re-evaluation of patient's condition.   (including critical care time)  Medical Decision Making / ED Course I have reviewed the nursing notes for this encounter and the patient's prior records (if available in EHR or on provided paperwork).   Nicholas Hutchinson was evaluated in Emergency Department on 06/18/2019 for the symptoms described in the history of present illness. He was evaluated in the context of the global COVID-19 pandemic, which necessitated consideration that the patient might be at risk for infection with the SARS-CoV-2 virus  that causes COVID-19. Institutional protocols and algorithms that pertain to the evaluation of patients at risk for COVID-19 are in a state of rapid change based on information released by regulatory bodies including the CDC and federal and state organizations. These policies and algorithms were followed during the patient's care in the ED.  Patient presents unresponsive found outside of a motel in cold weather. Began seizing after Narcan administration which did not respond to Versed.  CBG of 314 by EMS.  Hemodynamically stable per EMS.  With decerebrate rigidity versus tonic seizure.  Did not respond to 2 mg of Ativan. Patient required RSI for airway protection Once paralytic were administered, vitals were easier to obtain and patient was noted to be hemodynamically stable.  He was found to be hypothermic at 32.6C.  Warming measures initiated with bear hugger and warmed IV fluids.  Screening labs and imaging ordered.  CBG here 251.  CBC without leukocytosis, anemia or thrombocytopenia.  Metabolic panel without significant electrolyte derangements or renal sufficiency.  Hyperglycemia without evidence of DKA.  Elevated lactic acid greater than 4.  Believe this is likely related  to seizing and not sepsis.  Chest x-ray without evidence of pneumonia. CT head without ICH or mass-effect.  5:46 AM  Consulted neurology for stat EEG to assess for subclinical seizures. Will consult ICU for admission, further work-up, and further management.  ABG as above with high O2. FiO2 dropped to 50%  6:35 AM Spoke with ICU who will eval and admit      Final Clinical Impression(s) / ED Diagnoses Final diagnoses:  Unresponsive  Seizure-like activity (HCC)  Elevated lactic acid level  Hypothermia due to cold environment      This chart was dictated using voice recognition software.  Despite best efforts to proofread,  errors can occur which can change the documentation meaning.   Nira Conn, MD 06/18/19 [phone]

## 2019-06-18 NOTE — ED Notes (Signed)
Inserted 16 fr. Foley without issues. 10 cc sterile water in bulb.

## 2019-06-18 NOTE — ED Notes (Signed)
Breath sounds diminished on L side per MD

## 2019-06-18 NOTE — Procedures (Signed)
Extubation Procedure Note  Patient Details:   Name: Nicholas Hutchinson DOB: 02/25/1876 MRN: [id number]   Airway Documentation:    Vent end date: 06/18/19 Vent end time: 1240   Evaluation  O2 sats: stable throughout Complications: No apparent complications Patient did tolerate procedure well. Bilateral Breath Sounds: Clear   Yes   RT extubated patient to 4L [place] per MD order with RN at bedside. Patient tolerated well. No stridor noted. RT will continue to monitor as needed.   Lura Em 06/18/2019, 1:22 PM

## 2019-06-18 NOTE — ED Notes (Signed)
Secondary assessment complete, no trauma noted

## 2019-06-18 NOTE — H&P (Signed)
NAME:  Nicholas Hutchinson, MRN:  [id number], DOB:  02/25/1876, LOS: 0 ADMISSION DATE:  06/18/2019, CONSULTATION DATE: 06/18/2019 REFERRING MD: ED, CHIEF COMPLAINT: Seizure  Brief History   34 year old found down administered Narcan began disease brought to emergency room in status  History of present illness   Patient is a Nicholas Hutchinson found unresponsive outside about 4 hours hotel.  EMT arrived at the scene administered 2 mg of Narcan patient developed seizure activity he was given 7 mg of Versed and 2 mg Ativan being bagged on the way to the emergency room.  Once in the ER he was intubated.  Core body temperature was 90 F he was started on warming.  On examination there is no evidence of bruising or lacerations.  CT scan of the brain was normal.  Post intubation chest x-ray is clear.  ABG shows pH 7.365 P C O2 of 40 with a PO2 of 594 post intubation.  2 saturation was 90% time of arrival.  Chemistries only pertinent for hyperglycemia with a glucose of 274 otherwise unremarkable.  White count is 11 hemoglobin is 15 platelet count was 235.  Past Medical History  Other than above no other medical history available identification unclear  Significant Hospital Events   Intubation and admission to the ICU 06/18/2019  Consults:  Neurology  Procedures:  Intubation  Significant Diagnostic Tests:  As above  Micro Data:  NA  Antimicrobials:  NA  Interim history/subjective:  NA  Objective   Blood pressure (!) 89/58, pulse 65, temperature (!) 94.6 F (34.8 C), resp. rate 18, height 5\' 11"  (1.803 m), weight 81.6 kg, SpO2 100 %.    Vent Mode: PRVC FiO2 (%):  [50 %-100 %] 50 % Set Rate:  [18 bmp] 18 bmp Vt Set:  [600 mL] 600 mL PEEP:  [5 cmH20] 5 cmH20   Intake/Output Summary (Last 24 hours) at 06/18/2019 2952 Last data filed at 06/18/2019 0543 Gross per 24 hour  Intake --  Output 655 ml  Net -655 ml   Filed Weights   06/18/19 0405  Weight: 81.6 kg     Examination: General: Well-developed male HENT: Intubated some scleral injection Lungs: Clear bilaterally Cardiovascular: Regular rate and rhythm Abdomen: Scaphoid benign bowel sounds hypoactive Extremities: Within normal limits Neuro: Pupils fixed at 2 mm with poor oculogyric movement although nursing reports some spontaneous movement with blood gas GU: Within normal limits  Resolved Hospital Problem list   NA  Assessment & Plan:  1.  Status post seizure: We will place on continuous Versed drip per neurology recommendations.  Will monitor in the ICU setting.  Once await further history may elucidate etiology of seizure.  2.  Hypothermia: Will warmed to 98 degrees.  3.  Respiratory failure: We will maintain current level of ventilator support.  Will add sedative agent if necessary maintain medical support devices  4.  For hyperglycemia we will place on sliding scale insulin.  Best practice:  Diet: N.p.o. Pain/Anxiety/Delirium protocol (if indicated): Continuous Versed per neurology recommendations VAP protocol (if indicated): Yes DVT prophylaxis: SCD GI prophylaxis: Pepcid Glucose control: Sliding scale Mobility: Bedrest Code Status: Full Family Communication: Unable to obtain Disposition: To ICU for observation therapy  Labs   CBC: Recent Labs  Lab 06/18/19 0407 06/18/19 0517 06/18/19 0556  WBC 9.2  --   --   NEUTROABS 6.1  --   --   HGB 15.1 15.3 11.9*  HCT 46.6 45.0 35.0*  MCV 95.5  --   --  PLT 235  --   --     Basic Metabolic Panel: Recent Labs  Lab 06/18/19 0407 06/18/19 0414 06/18/19 0517 06/18/19 0556  NA 136  --  138 139  K 5.0  --  4.1 4.0  CL 99  --   --   --   CO2 24  --   --   --   GLUCOSE 274*  --   --   --   BUN 13  --   --   --   CREATININE 1.12  --   --   --   CALCIUM 8.8*  --   --   --   MG  --  2.3  --   --    GFR: Estimated Creatinine Clearance: -3.7 mL/min (by C-G formula based on SCr of 1.12 mg/dL). Recent Labs  Lab  06/18/19 0407 06/18/19 0613  WBC 9.2  --   LATICACIDVEN 4.2* 3.0*    Liver Function Tests: Recent Labs  Lab 06/18/19 0407  AST 32  ALT 19  ALKPHOS 71  BILITOT 0.4  PROT 7.4  ALBUMIN 4.3   No results for input(s): LIPASE, AMYLASE in the last 168 hours. No results for input(s): AMMONIA in the last 168 hours.  ABG    Component Value Date/Time   PHART 7.365 06/18/2019 0556   PCO2ART 40.4 06/18/2019 0556   PO2ART 594 (H) 06/18/2019 0556   HCO3 23.9 06/18/2019 0556   TCO2 25 06/18/2019 0556   ACIDBASEDEF 2.0 06/18/2019 0556   O2SAT 100.0 06/18/2019 0556     Coagulation Profile: No results for input(s): INR, PROTIME in the last 168 hours.  Cardiac Enzymes: Recent Labs  Lab 06/18/19 0407  CKTOTAL 274  CKMB 5.1*    HbA1C: No results found for: HGBA1C  CBG: Recent Labs  Lab 06/18/19 0346  GLUCAP 251*    Review of Systems:   Unable to obtain  Past Medical History  He,  has no past medical history on file.   Surgical History   Unable to obtain  Social History    Unable to obtain  Family History   His family history is not on file.   Allergies Not on File   Home Medications  Prior to Admission medications   Not on File     Critical care time: Over 35 minutes was spent bedside evaluation chart review and critical care planning

## 2019-06-18 NOTE — Procedures (Signed)
Patient Name: Nicholas Hutchinson  MRN: [id number]  Epilepsy Attending: Charlsie Quest  Referring Physician/Provider: Dr. Caryl Pina Date: 06/18/2019 Duration: 24.01 mins  Patient history: Unidentified male presenting with hypothermia and seizures after being found down for an extended period of time outside a hotel.  EEG evaluate for seizures.  Level of alertness: awake, asleep (sedated)  AEDs during EEG study: Versed  Technical aspects: This EEG study was done with scalp electrodes positioned according to the 10-20 International system of electrode placement. Electrical activity was acquired at a sampling rate of 500Hz  and reviewed with a high frequency filter of 70Hz  and a low frequency filter of 1Hz . EEG data were recorded continuously and digitally stored.   Description: The posterior dominant rhythm consists of 9-10 Hz activity of moderate voltage (25-35 uV) seen predominantly in posterior head regions, symmetric and reactive to eye opening and eye closing. Sleep was characterized by vertex waves, sleep spindles (12-14Hz ), maximal frontocentral region. Hyperventilation and photic stimulation were not performed.  IMPRESSION: This study is within normal limits. No seizures or epileptiform discharges were seen throughout the recording.  Ettamae Barkett 

## 2019-06-18 NOTE — ED Notes (Addendum)
RSI kit to bedside. 25 mg etomodate. 75 mg Rocuronium ordered by MD verbal orders.

## 2019-06-18 NOTE — ED Notes (Signed)
CT called about need for scan. Told to come to bay 3 when ready. RT notified and called to bedside.

## 2019-06-18 NOTE — ED Notes (Signed)
X-ray called to bedside to confirm ETT placement.

## 2019-06-18 NOTE — ED Notes (Signed)
Pt transported to CT with RT, this nurse, and ED Tech

## 2019-06-18 NOTE — Progress Notes (Signed)
Pt allowed me to place him back on the monitor. Orders received for Mg and Phos replacement, will attempt to administer if pt allows.  Pt fully dressed laying in bed, insists he is leaving as soon as his phone is charged.

## 2019-06-18 NOTE — ED Notes (Signed)
Pt's personal items inventoried and put in pt belonging at bedside. Items include: one pair tennis shoes, two white socks, one grey hat, khaki cargo shorts with keys, cigarettes, lighter, and loose coin change, leather belt. No personal identifiers found on pt.

## 2019-06-18 NOTE — ED Notes (Signed)
ETT retracted to 24 ATT

## 2019-06-18 NOTE — Progress Notes (Signed)
Mag and phos critically low Patient insists on going home  Plan  -replete mag and phos  - wil see if securuty services can come and talk to him     SIGNATURE    Dr. Kalman Shan, M.D., F.C.C.P,  Pulmonary and Critical Care Medicine Staff Physician, [facility] Center Director - Interstitial Lung Disease  Program  Pulmonary Fibrosis [facility] [place], [place], [address]  Pager: [phone], If no answer or between  15:00h - 7:00h: call [phone] Telephone: [phone]  3:31 PM 06/18/2019    Recent Labs  Lab 06/18/19 0407 06/18/19 0407 06/18/19 0414 06/18/19 0517 06/18/19 0517 06/18/19 0556 06/18/19 1033 06/18/19 1345  NA 136  --   --  138  --  139 137  --   K 5.0   < >  --  4.1   < > 4.0 3.5  --   CL 99  --   --   --   --   --  109  --   CO2 24  --   --   --   --   --  22  --   GLUCOSE 274*  --   --   --   --   --  83  --   BUN 13  --   --   --   --   --  10  --   CREATININE 1.12  --   --   --   --   --  0.77  --   CALCIUM 8.8*  --   --   --   --   --  8.2*  --   MG  --   --  2.3  --   --   --   --  1.5*  PHOS  --   --   --   --   --   --   --  1.9*   < > = values in this interval not displayed.

## 2019-06-18 NOTE — Progress Notes (Signed)
Transported patient to CT and back to 024C without event.

## 2019-06-18 NOTE — ED Notes (Signed)
OG tube put to [facility] per MD.

## 2019-06-18 NOTE — ED Notes (Signed)
ETT 7.5 25 ATT

## 2019-06-18 NOTE — ED Notes (Signed)
Versed given for agitation. Pt has dark red/brown fluid in OG tube.

## 2019-06-18 NOTE — ED Notes (Signed)
Pt withdrawing to pain during ABG draw.
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
## 2019-06-18 NOTE — ED Notes (Signed)
Report given to 4n 

## 2019-06-18 NOTE — Progress Notes (Addendum)
[facility] PCCM AM Follow Up    S: RN report pt awake, following commands. EEG pending   O: Blood pressure (!) 94/54, pulse 71, temperature (P) 98.1 F (36.7 C), resp. rate 18, height 5\' 11"  (1.803 m), weight 81.6 kg, SpO2 99 %.  General: adult male lying in bed in NAD on vent HEENT: MM pink/moist, ETT, pupils equal/reactive Neuro: awakens to voice, nods / follows commands, MAE, normal strength CV: s1s2 RRR, no m/r/g PULM:  Non-labored on vent, lungs bilaterally clear  GI: soft, bsx4 active  Extremities: warm/dry, no edema  Skin: no rashes or lesions  CXR 4/23 >> images personally reviewed, ETT in good position, gastric tube in stomach, no acute infiltrate  Recent Labs  Lab 06/18/19 0407 06/18/19 0517 06/18/19 0556  HGB 15.1 15.3 11.9*  HCT 46.6 45.0 35.0*  WBC 9.2  --   --   PLT 235  --   --    Recent Labs  Lab 06/18/19 0407 06/18/19 0407 06/18/19 0414 06/18/19 0517 06/18/19 0556  NA 136  --   --  138 139  K 5.0   < >  --  4.1 4.0  CL 99  --   --   --   --   CO2 24  --   --   --   --   GLUCOSE 274*  --   --   --   --   BUN 13  --   --   --   --   CREATININE 1.12  --   --   --   --   CALCIUM 8.8*  --   --   --   --   MG  --   --  2.3  --   --    < > = values in this interval not displayed.   COVID 4/23 >> negative  Influenza A/B 4/23 >> negative  BCx2 4/23 >>  UC 4/23 >>   A: Polysubstance Overdose ETOH Intoxication  Seizure Hypotension  Hypothermia  At Risk Rhabdomyolysis  Rule Out Toxic Ingestion Hyperglycemia  Acute Toxic Encephalopathy   P: See H&P from 0600 for full details Continue PRVC support for now EEG in progress, await findings PRN ativan for seizure Change sedation to propofol with hx of seizure and high likelihood of extubation  Follow neuro exam  Continue rewarming measures  Suspect shock related to hypothermia, medications, intoxication.   Wean levophed for MAP > 65  LR at 134ml/hr  Assess CMP, CK, lactate, volatiles now.  Rule  out developing anion gap May need to notify poison control pending above Will reassess this afternoon for possible extubation    Additional CC Time: 32 minutes   32m, MSN, NP-C [facility] 06/18/2019, 9:22 AM   Please see [email or web address] for pager details.

## 2019-06-18 NOTE — Progress Notes (Signed)
RT note: RT and RN transported vent patient from ED to 4N 19. Vital signs stable through out.

## 2019-06-18 NOTE — Progress Notes (Signed)
Called ED RN. Pt not available for EEG due to pending transfer to 4N. Will do upon arrival to 4N.

## 2019-06-18 NOTE — ED Notes (Signed)
25 mg etomadate IVP

## 2019-06-18 NOTE — ED Notes (Signed)
Pt returned from CT. Breathing over vent. Attempting to swallow.
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
## 2019-06-18 NOTE — ED Notes (Signed)
RT at bedside.

## 2019-06-18 NOTE — ED Notes (Signed)
OG inserted with pos stomach sounds.

## 2019-06-18 NOTE — ED Notes (Signed)
MD at bedside. 2 mg IV ativan given per verbal orders. RSI kit ordered to bedside.

## 2019-06-19 LAB — URINE CULTURE: Culture: NO GROWTH

## 2019-06-23 LAB — CULTURE, BLOOD (ROUTINE X 2)
Culture: NO GROWTH
Culture: NO GROWTH

## 2019-07-05 NOTE — Discharge Summary (Signed)
DISCHARGE SUMMARY    Date of admit: 06/18/2019  3:43 AM Date of discharge: 06/18/2019  4:35 PM Length of Stay: 1 days  PCP is Patient, No Pcp Per   DISCHARGE DIAGNOSIS Ethanol intoxication Electrolyte imbalance   PROBLEM LIST Active Problems:   Seizure-like activity (HCC)   Unresponsive    SUMMARY Nicholas Hutchinson was 34 y.o. patient with    has no past medical history on file.   has no past surgical history on file.   Admitted on 06/18/2019 with   Date of admit 06/18/2019 with LOS 0 for today 06/18/2019 : [name] is  - asked by RN to evaluate for possible extubation. HE is now wide awake and demanding extubation. Admits to drinking ETOH in the form of brandy, whiskey but denies anti-freeze, paint, or Ethyelne glycol or poisons. Denies intent to self harm. Admits intent of fun  After he ws extubated despite electrolyte imbalance he signed out against medical advice and left the hospital     SIGNATURE    Dr. Kalman Shan, M.D., F.C.C.P,  Pulmonary and Critical Care Medicine Staff Physician, [facility] Director - Interstitial Lung Disease  Program  Pulmonary [facility] at [facility] [place], [place], [address]  Pager: [phone], If no answer or between  15:00h - 7:00h: call [phone] Telephone: [phone]  5:25 PM 07/05/2019                 SIGNED Dr. Kalman Shan, M.D., F.C.C.P Pulmonary and Critical Care Medicine Staff Physician [facility] Pager: [phone], If no answer or between  15:00h - 7:00h: call [phone]  07/05/2019 5:19 PM
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
